# Patient Record
Sex: Male | Born: 1990 | Race: White | Hispanic: No | Marital: Single | Smoking: Never smoker
Health system: Southern US, Community
[De-identification: ages and names within clinical notes are randomized; demographics above are authoritative.]

---

## 2015-04-06 ENCOUNTER — Emergency Department
Admission: EM | Admit: 2015-04-06 | Discharge: 2015-04-06 | Disposition: A | Discharge status: Home / Self Care | Payer: Self-pay | Attending: Emergency Medicine | Admitting: Emergency Medicine

## 2015-04-06 ENCOUNTER — Encounter: Payer: Self-pay | Admitting: Emergency Medicine

## 2015-04-06 ENCOUNTER — Emergency Department: Payer: Self-pay

## 2015-04-06 ENCOUNTER — Other Ambulatory Visit: Payer: Self-pay

## 2015-04-06 DIAGNOSIS — R0789 Other chest pain: Secondary | ICD-10-CM | POA: Insufficient documentation

## 2015-04-06 LAB — CBC
HCT: 46.5 % (ref 40.0–52.0)
Hemoglobin: 15.9 g/dL (ref 13.0–18.0)
MCH: 29 pg (ref 26.0–34.0)
MCHC: 34.3 g/dL (ref 32.0–36.0)
MCV: 84.6 fL (ref 80.0–100.0)
PLATELETS: 182 10*3/uL (ref 150–440)
RBC: 5.49 MIL/uL (ref 4.40–5.90)
RDW: 12.7 % (ref 11.5–14.5)
WBC: 5 10*3/uL (ref 3.8–10.6)

## 2015-04-06 LAB — BASIC METABOLIC PANEL
Anion gap: 11 (ref 5–15)
BUN: 10 mg/dL (ref 6–20)
CALCIUM: 9.7 mg/dL (ref 8.9–10.3)
CO2: 28 mmol/L (ref 22–32)
CREATININE: 1.07 mg/dL (ref 0.61–1.24)
Chloride: 99 mmol/L — ABNORMAL LOW (ref 101–111)
GFR calc non Af Amer: >60 mL/min (ref >60)
Glucose, Bld: 83 mg/dL (ref 65–99)
Potassium: 4.3 mmol/L (ref 3.5–5.1)
SODIUM: 138 mmol/L (ref 135–145)

## 2015-04-06 LAB — TROPONIN I

## 2015-04-06 MED ORDER — HYDROCODONE-ACETAMINOPHEN 5-325 MG PO TABS: 1.0000 | ORAL_TABLET | Freq: Four times a day (QID) | ORAL | Status: AC | PRN
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
## 2015-04-06 NOTE — Discharge Instructions (Signed)
Chest Wall Pain °Chest wall pain is pain in or around the bones and muscles of your chest. It may take up to 6 weeks to get better. It may take longer if you must stay physically active in your work and activities.  °CAUSES  °Chest wall pain may happen on its own. However, it may be caused by: °· A viral illness like the flu. °· Injury. °· Coughing. °· Exercise. °· Arthritis. °· Fibromyalgia. °· Shingles. °HOME CARE INSTRUCTIONS  °· Avoid overtiring physical activity. Try not to strain or perform activities that cause pain. This includes any activities using your chest or your abdominal and side muscles, especially if heavy weights are used. °· Put ice on the sore area. °¨ Put ice in a plastic bag. °¨ Place a towel between your skin and the bag. °¨ Leave the ice on for 15-20 minutes per hour while awake for the first 2 days. °· Only take over-the-counter or prescription medicines for pain, discomfort, or fever as directed by your caregiver. °SEEK IMMEDIATE MEDICAL CARE IF:  °· Your pain increases, or you are very uncomfortable. °· You have a fever. °· Your chest pain becomes worse. °· You have new, unexplained symptoms. °· You have nausea or vomiting. °· You feel sweaty or lightheaded. °· You have a cough with phlegm (sputum), or you cough up blood. °MAKE SURE YOU:  °· Understand these instructions. °· Will watch your condition. °· Will get help right away if you are not doing well or get worse. °Document Released: 08/08/2005 Document Revised: 10/31/2011 Document Reviewed: 04/04/2011 °[employer]® Patient Information ©2015 [employer]. This information is not intended to replace advice given to you by your health care provider. Make sure you discuss any questions you have with your health care provider. ° °Please return immediately if condition worsens. Please contact her primary physician or the physician you were given for referral. If you have any specialist physicians involved in her treatment and plan please  also contact them. Thank you for using [facility]. ° °

## 2015-04-20 ENCOUNTER — Other Ambulatory Visit: Payer: Self-pay

## 2015-04-20 ENCOUNTER — Emergency Department: Payer: Self-pay

## 2015-04-20 ENCOUNTER — Encounter: Payer: Self-pay | Admitting: Emergency Medicine

## 2015-04-20 ENCOUNTER — Emergency Department
Admission: EM | Admit: 2015-04-20 | Discharge: 2015-04-20 | Disposition: A | Discharge status: Home / Self Care | Payer: Self-pay | Attending: Emergency Medicine | Admitting: Emergency Medicine

## 2015-04-20 DIAGNOSIS — R17 Unspecified jaundice: Secondary | ICD-10-CM

## 2015-04-20 DIAGNOSIS — R1013 Epigastric pain: Secondary | ICD-10-CM | POA: Insufficient documentation

## 2015-04-20 LAB — COMPREHENSIVE METABOLIC PANEL
ALBUMIN: 4.8 g/dL (ref 3.5–5.0)
ALT: 12 U/L — AB (ref 17–63)
ANION GAP: 7 (ref 5–15)
AST: 26 U/L (ref 15–41)
Alkaline Phosphatase: 57 U/L (ref 38–126)
BUN: 10 mg/dL (ref 6–20)
CHLORIDE: 104 mmol/L (ref 101–111)
CO2: 28 mmol/L (ref 22–32)
CREATININE: 1.05 mg/dL (ref 0.61–1.24)
Calcium: 9.9 mg/dL (ref 8.9–10.3)
GFR calc non Af Amer: >60 mL/min (ref >60)
GLUCOSE: 86 mg/dL (ref 65–99)
Potassium: 4 mmol/L (ref 3.5–5.1)
SODIUM: 139 mmol/L (ref 135–145)
Total Bilirubin: 3.1 mg/dL — ABNORMAL HIGH (ref 0.3–1.2)
Total Protein: 7.4 g/dL (ref 6.5–8.1)

## 2015-04-20 LAB — CBC WITH DIFFERENTIAL/PLATELET
Basophils Absolute: 0 10*3/uL (ref 0–0.1)
Basophils Relative: 1 %
Eosinophils Absolute: 0.1 10*3/uL (ref 0–0.7)
Eosinophils Relative: 2 %
HEMATOCRIT: 45.9 % (ref 40.0–52.0)
HEMOGLOBIN: 16.1 g/dL (ref 13.0–18.0)
LYMPHS ABS: 2.3 10*3/uL (ref 1.0–3.6)
Lymphocytes Relative: 43 %
MCH: 29.5 pg (ref 26.0–34.0)
MCHC: 35.2 g/dL (ref 32.0–36.0)
MCV: 83.7 fL (ref 80.0–100.0)
MONO ABS: 0.5 10*3/uL (ref 0.2–1.0)
MONOS PCT: 9 %
NEUTROS ABS: 2.5 10*3/uL (ref 1.4–6.5)
NEUTROS PCT: 45 %
Platelets: 185 10*3/uL (ref 150–440)
RBC: 5.48 MIL/uL (ref 4.40–5.90)
RDW: 12.6 % (ref 11.5–14.5)
WBC: 5.5 10*3/uL (ref 3.8–10.6)

## 2015-04-20 LAB — URINALYSIS COMPLETE WITH MICROSCOPIC (ARMC ONLY)
BACTERIA UA: NONE SEEN
Bilirubin Urine: NEGATIVE
GLUCOSE, UA: NEGATIVE mg/dL
Hgb urine dipstick: NEGATIVE
Ketones, ur: NEGATIVE mg/dL
Leukocytes, UA: NEGATIVE
Nitrite: NEGATIVE
Protein, ur: NEGATIVE mg/dL
Specific Gravity, Urine: 1.008 (ref 1.005–1.030)
pH: 7 (ref 5.0–8.0)

## 2015-04-20 MED ORDER — ONDANSETRON HCL 4 MG PO TABS: 4.0000 mg | ORAL_TABLET | Freq: Every day | ORAL | Status: AC | PRN

## 2015-04-20 MED ORDER — DICYCLOMINE HCL 20 MG PO TABS: 20.0000 mg | ORAL_TABLET | Freq: Three times a day (TID) | ORAL | Status: AC | PRN

## 2015-04-20 NOTE — ED Provider Notes (Signed)
[facility] Emergency Department Provider Note    ____________________________________________  Time seen: 1410  I have reviewed the triage vital signs and the nursing notes.   HISTORY  Chief Complaint Abdominal Pain and Dizziness   History limited by: Not Limited   HPI Tommy Dunn is a 24 y.o. male who presents to the emergency department today primarily because of concerns for abdominal pain, nausea and vomiting. He states that the symptoms started primarily this morning. He describes pain as being located in the epigastric and right upper quadrant. He states that it is usually a dull pain although there are sharp times. He states that the pain was worse when he tried eating some crackers this morning. He did have nausea and emesis afterwards. It was nonbloody emesis. He denies any recent diarrhea or change in his bowel movements. Denies any recent fevers.     History reviewed. No pertinent past medical history.  There are no active problems to display for this patient.   History reviewed. No pertinent past surgical history.  Current Outpatient Rx  Name  Route  Sig  Dispense  Refill  . HYDROcodone-acetaminophen (NORCO) 5-325 MG per tablet   Oral   Take 1 tablet by mouth every 6 (six) hours as needed for moderate pain.   20 tablet   0     Allergies Review of patient's allergies indicates no known allergies.  No family history on file.  Social History Social History  Substance Use Topics  . Smoking status: Never Smoker   . Smokeless tobacco: None  . Alcohol Use: No    Review of Systems  Constitutional: Negative for fever. Cardiovascular: Negative for chest pain. Respiratory: Negative for shortness of breath. Gastrointestinal: Positive for epigastric abdominal pain. Genitourinary: Negative for dysuria. Musculoskeletal: Negative for back pain. Skin: Negative for rash. Neurological: Negative for headaches, focal weakness or  numbness.   10-point ROS otherwise negative.  ____________________________________________   PHYSICAL EXAM:  VITAL SIGNS: ED Triage Vitals  Enc Vitals Group     BP 04/20/15 1157 119/86 mmHg     Pulse Rate 04/20/15 1157 97     Resp 04/20/15 1157 18     Temp 04/20/15 1157 98.3 F (36.8 C)     Temp Source 04/20/15 1157 Oral     SpO2 04/20/15 1157 99 %     Weight 04/20/15 1157 170 lb (77.111 kg)     Height 04/20/15 1157  (1.753 m)     Head Cir --      Peak Flow --      Pain Score 04/20/15 1157 8   Constitutional: Alert and oriented. Well appearing and in no distress. Eyes: Conjunctivae are normal. PERRL. Normal extraocular movements. ENT   Head: Normocephalic and atraumatic.   Nose: No congestion/rhinnorhea.   Mouth/Throat: Mucous membranes are moist.   Neck: No stridor. Hematological/Lymphatic/Immunilogical: No cervical lymphadenopathy. Cardiovascular: Normal rate, regular rhythm.  No murmurs, rubs, or gallops. Respiratory: Normal respiratory effort without tachypnea nor retractions. Breath sounds are clear and equal bilaterally. No wheezes/rales/rhonchi. Gastrointestinal: Soft and minimally tender in the epigastric and right upper quadrant. No rebound. No guarding. No distention.  Genitourinary: Deferred Musculoskeletal: Normal range of motion in all extremities. No joint effusions.  No lower extremity tenderness nor edema. Neurologic:  Normal speech and language. No gross focal neurologic deficits are appreciated. Speech is normal.  Skin:  Skin is warm, dry and intact. No rash noted. Psychiatric: Mood and affect are normal. Speech and behavior are  normal. Patient exhibits appropriate insight and judgment.  ____________________________________________    LABS (pertinent positives/negatives)  Labs Reviewed  COMPREHENSIVE METABOLIC PANEL - Abnormal; Notable for the following:    ALT 12 (*)    Total Bilirubin 3.1 (*)    All other components within  normal limits  CBC WITH DIFFERENTIAL/PLATELET  URINALYSIS COMPLETEWITH MICROSCOPIC ([facility] ONLY)     ____________________________________________   EKG  I, Phineas Semen, attending physician, personally viewed and interpreted this EKG  EKG Time: 1204 Rate: 72 Rhythm: normal sinus rhythm Axis: normal Intervals: normal QRS: normal ST changes: no st elevation    ____________________________________________    RADIOLOGY  RUQ US IMPRESSION: 1. Small amount of echogenic sludge in the gallbladder but no gallstones or findings for acute cholecystitis. 2. Normal caliber common bile duct. 3. Normal liver.  ____________________________________________   PROCEDURES  Procedure(s) performed: None  Critical Care performed: No  ____________________________________________   INITIAL IMPRESSION / ASSESSMENT AND PLAN / ED COURSE  Pertinent labs & imaging results that were available during my care of the patient were reviewed by me and considered in my medical decision making (see chart for details).  Patient presented to the emergency department today with complaints of abdominal pain nausea and vomiting. Patient's bilirubin was elevated however no other elevated obstructing enzymes. I did obtain an ultrasound which did not show any overly concerning findings of some mild gallbladder sludge. Will plan on treating for nausea vomiting and abdominal pain. Discussed with patient's importance of follow-up to recheck bilirubin.  ____________________________________________   FINAL CLINICAL IMPRESSION(S) / ED DIAGNOSES  Final diagnoses:  Epigastric abdominal pain  Elevated bilirubin     Phineas Semen, MD 04/20/15 1528

## 2015-04-20 NOTE — Discharge Instructions (Signed)
As we discussed please get your bilirubin level rechecked in roughly 1-2 weeks. It was 3.1 today. Please seek medical attention for any high fevers, chest pain, shortness of breath, change in behavior, persistent vomiting, bloody stool or any other new or concerning symptoms.   Abdominal Pain Many things can cause abdominal pain. Usually, abdominal pain is not caused by a disease and will improve without treatment. It can often be observed and treated at home. Your health care provider will do a physical exam and possibly order blood tests and X-rays to help determine the seriousness of your pain. However, in many cases, more time must pass before a clear cause of the pain can be found. Before that point, your health care provider may not know if you need more testing or further treatment. HOME CARE INSTRUCTIONS  Monitor your abdominal pain for any changes. The following actions may help to alleviate any discomfort you are experiencing:  Only take over-the-counter or prescription medicines as directed by your health care provider.  Do not take laxatives unless directed to do so by your health care provider.  Try a clear liquid diet (broth, tea, or water) as directed by your health care provider. Slowly move to a bland diet as tolerated. SEEK MEDICAL CARE IF:  You have unexplained abdominal pain.  You have abdominal pain associated with nausea or diarrhea.  You have pain when you urinate or have a bowel movement.  You experience abdominal pain that wakes you in the night.  You have abdominal pain that is worsened or improved by eating food.  You have abdominal pain that is worsened with eating fatty foods.  You have a fever. SEEK IMMEDIATE MEDICAL CARE IF:   Your pain does not go away within 2 hours.  You keep throwing up (vomiting).  Your pain is felt only in portions of the abdomen, such as the right side or the left lower portion of the abdomen.  You pass bloody or black tarry  stools. MAKE SURE YOU:  Understand these instructions.   Will watch your condition.   Will get help right away if you are not doing well or get worse.  Document Released: 05/18/2005 Document Revised: 08/13/2013 Document Reviewed: 04/17/2013 [facility] Patient Information 2015 [place], [place]. This information is not intended to replace advice given to you by your health care provider. Make sure you discuss any questions you have with your health care provider.
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
## 2015-04-20 NOTE — ED Notes (Signed)
Patient returned from Ultrasound. 

## 2015-04-20 NOTE — ED Notes (Signed)
Patient transported to Ultrasound 

## 2015-04-28 ENCOUNTER — Emergency Department
Admission: EM | Admit: 2015-04-28 | Discharge: 2015-04-28 | Disposition: A | Discharge status: Home / Self Care | Payer: Self-pay | Attending: Emergency Medicine | Admitting: Emergency Medicine

## 2015-04-28 ENCOUNTER — Encounter: Payer: Self-pay | Admitting: *Deleted

## 2015-04-28 ENCOUNTER — Emergency Department: Payer: Self-pay

## 2015-04-28 DIAGNOSIS — K5900 Constipation, unspecified: Secondary | ICD-10-CM | POA: Insufficient documentation

## 2015-04-28 DIAGNOSIS — K2970 Gastritis, unspecified, without bleeding: Secondary | ICD-10-CM | POA: Insufficient documentation

## 2015-04-28 DIAGNOSIS — R1084 Generalized abdominal pain: Secondary | ICD-10-CM

## 2015-04-28 LAB — COMPREHENSIVE METABOLIC PANEL
ALBUMIN: 4.5 g/dL (ref 3.5–5.0)
ALT: 9 U/L — ABNORMAL LOW (ref 17–63)
ANION GAP: 8 (ref 5–15)
AST: 22 U/L (ref 15–41)
Alkaline Phosphatase: 52 U/L (ref 38–126)
BUN: 10 mg/dL (ref 6–20)
CHLORIDE: 102 mmol/L (ref 101–111)
CO2: 25 mmol/L (ref 22–32)
Calcium: 9.2 mg/dL (ref 8.9–10.3)
Creatinine, Ser: 0.95 mg/dL (ref 0.61–1.24)
GFR calc Af Amer: >60 mL/min (ref >60)
GFR calc non Af Amer: >60 mL/min (ref >60)
GLUCOSE: 104 mg/dL — AB (ref 65–99)
POTASSIUM: 3.6 mmol/L (ref 3.5–5.1)
SODIUM: 135 mmol/L (ref 135–145)
Total Bilirubin: 1.5 mg/dL — ABNORMAL HIGH (ref 0.3–1.2)
Total Protein: 7.2 g/dL (ref 6.5–8.1)

## 2015-04-28 LAB — LIPASE, BLOOD: Lipase: 36 U/L (ref 22–51)

## 2015-04-28 LAB — CBC
HEMATOCRIT: 43.9 % (ref 40.0–52.0)
HEMOGLOBIN: 15.5 g/dL (ref 13.0–18.0)
MCH: 29.5 pg (ref 26.0–34.0)
MCHC: 35.4 g/dL (ref 32.0–36.0)
MCV: 83.5 fL (ref 80.0–100.0)
Platelets: 174 10*3/uL (ref 150–440)
RBC: 5.25 MIL/uL (ref 4.40–5.90)
RDW: 12.6 % (ref 11.5–14.5)
WBC: 5.8 10*3/uL (ref 3.8–10.6)

## 2015-04-28 MED ORDER — POLYETHYLENE GLYCOL 3350 17 GM/SCOOP PO POWD: 17.0000 g | Freq: Every day | ORAL | Status: AC

## 2015-04-28 MED ORDER — PANTOPRAZOLE SODIUM 40 MG PO TBEC: 40.0000 mg | DELAYED_RELEASE_TABLET | Freq: Every day | ORAL | Status: AC

## 2015-04-28 NOTE — ED Notes (Signed)
Lab called to report corrected results for total bilirubin of 1.5  Per Dr. Derrill Kay no followup needed.

## 2015-04-28 NOTE — ED Notes (Signed)
Patient transported to CT 

## 2015-04-28 NOTE — ED Provider Notes (Signed)
[facility] Emergency Department Provider Note  Time seen: 4:41 PM  I have reviewed the triage vital signs and the nursing notes.   HISTORY  Chief Complaint Abdominal Pain    HPI Tommy Dunn is a 24 y.o. male with no past medical history who presents the emergency department with continued abdominal pain 2 weeks. According to the patient he was seen in the emergency department for the same on 04/20/15. Patient states for the past 2 weeks he has had burning-type abdominal pain that is generalized. Patient cannot locate one area in particular that has been hurting more than any other area. Patient does note that it is worse with spicy foods or greasy foods. Patient's workup last time was within normal limits and he was discharged home with Phenergan and Bentyl. Patient states he has been taking these medications with little relief. Patient does note prior to this visit he was taking ibuprofen several times a day due to chronic headaches and back pain Patient denies any vomiting, but states he does get quite nauseated. Denies diarrhea, dysuria or fever. Describes his pain currently as moderate, burning sensation generalized over his entire abdomen.    History reviewed. No pertinent past medical history.  There are no active problems to display for this patient.   History reviewed. No pertinent past surgical history.  Current Outpatient Rx  Name  Route  Sig  Dispense  Refill  . dicyclomine (BENTYL) 20 MG tablet   Oral   Take 1 tablet (20 mg total) by mouth 3 (three) times daily as needed (abdominal pain).   20 tablet   0   . HYDROcodone-acetaminophen (NORCO) 5-325 MG per tablet   Oral   Take 1 tablet by mouth every 6 (six) hours as needed for moderate pain.   20 tablet   0   . ondansetron (ZOFRAN) 4 MG tablet   Oral   Take 1 tablet (4 mg total) by mouth daily as needed for nausea or vomiting.   20 tablet   0     Allergies Review of patient's  allergies indicates no known allergies.  No family history on file.  Social History Social History  Substance Use Topics  . Smoking status: Never Smoker   . Smokeless tobacco: None  . Alcohol Use: No    Review of Systems Constitutional: Negative for fever. Cardiovascular: Negative for chest pain. Respiratory: Negative for shortness of breath. Gastrointestinal:  Positive for abdominal pain/burning. Genitourinary: Negative for dysuria. Musculoskeletal: Negative for back pain. Skin: Negative for rash. Neurological: Negative for headache 10-point ROS otherwise negative.  ____________________________________________   PHYSICAL EXAM:  VITAL SIGNS: ED Triage Vitals  Enc Vitals Group     BP 04/28/15 1607 121/67 mmHg     Pulse Rate 04/28/15 1607 78     Resp 04/28/15 1607 18     Temp 04/28/15 1607 98 F (36.7 C)     Temp Source 04/28/15 1607 Oral     SpO2 04/28/15 1607 98 %     Weight 04/28/15 1607 170 lb (77.111 kg)     Height 04/28/15 1607  (1.753 m)     Head Cir --      Peak Flow --      Pain Score 04/28/15 1607 8     Pain Loc --      Pain Edu? --      Excl. in GC? --     Constitutional: Alert and oriented. Well appearing and in no distress. Eyes:  Normal exam ENT   Mouth/Throat: Mucous membranes are moist. Cardiovascular: Normal rate, regular rhythm. No murmur Respiratory: Normal respiratory effort without tachypnea nor retractions. Breath sounds are clear and equal bilaterally. No wheezes/rales/rhonchi. Gastrointestinal:  Soft, mild diffuse tenderness palpation without rebound or guarding. No CVA tenderness to palpation. No distention. Musculoskeletal: Nontender with normal range of motion in all extremities. No lower extremity tenderness or edema. Neurologic:  Normal speech and language. No gross focal neurologic deficits  Skin:  Skin is warm, dry and intact.  Psychiatric: Mood and affect are normal. Speech and behavior are normal.    ____________________________________________   RADIOLOGY  CT shows large stool burden otherwise within normal limits  ____________________________________________    INITIAL IMPRESSION / ASSESSMENT AND PLAN / ED COURSE  Pertinent labs & imaging results that were available during my care of the patient were reviewed by me and considered in my medical decision making (see chart for details).  Patient with diffuse/generalized abdominal pain for the past 2 weeks. Given this is his second visit for the same symptoms will proceed with labs, and a CT scan to help further evaluate. Patient's labs from last visit were largely within normal limits besides a slightly elevated bilirubin level. Patient's symptoms are suggestive of gastritis, and he does admit excessive NSAID use prior to 2 weeks ago. We will treat pain, and monitor in the emergency department.  Patient's labs are largely within normal limits. CT scan consistent with constipation otherwise within normal limits. Believe the patient's abdominal discomfort is likely a combination of gastritis due to excessive NSAID use, as well as constipation. I discussed with the patient Protonix, Maalox as needed, and MiraLAX. Patient is agreeable to plan and will follow-up with your medicine and symptoms do not improve. We have also discussed strict abdominal pain return precautions for any worsening pain, fever, patient agreeable plan.  ____________________________________________   FINAL CLINICAL IMPRESSION(S) / ED DIAGNOSES  abdominal pain Gastritis Constipation  Minna Antis, MD 04/28/15 1754

## 2015-04-28 NOTE — Discharge Instructions (Signed)
Abdominal Pain °Many things can cause abdominal pain. Usually, abdominal pain is not caused by a disease and will improve without treatment. It can often be observed and treated at home. Your health care provider will do a physical exam and possibly order blood tests and X-rays to help determine the seriousness of your pain. However, in many cases, more time must pass before a clear cause of the pain can be found. Before that point, your health care provider may not know if you need more testing or further treatment. °HOME CARE INSTRUCTIONS  °Monitor your abdominal pain for any changes. The following actions may help to alleviate any discomfort you are experiencing: °· Only take over-the-counter or prescription medicines as directed by your health care provider. °· Do not take laxatives unless directed to do so by your health care provider. °· Try a clear liquid diet (broth, tea, or water) as directed by your health care provider. Slowly move to a bland diet as tolerated. °SEEK MEDICAL CARE IF: °· You have unexplained abdominal pain. °· You have abdominal pain associated with nausea or diarrhea. °· You have pain when you urinate or have a bowel movement. °· You experience abdominal pain that wakes you in the night. °· You have abdominal pain that is worsened or improved by eating food. °· You have abdominal pain that is worsened with eating fatty foods. °· You have a fever. °SEEK IMMEDIATE MEDICAL CARE IF:  °· Your pain does not go away within 2 hours. °· You keep throwing up (vomiting). °· Your pain is felt only in portions of the abdomen, such as the right side or the left lower portion of the abdomen. °· You pass bloody or black tarry stools. °MAKE SURE YOU: °· Understand these instructions.   °· Will watch your condition.   °· Will get help right away if you are not doing well or get worse.   °Document Released: 05/18/2005 Document Revised: 08/13/2013 Document Reviewed: 04/17/2013 °[employer]® Patient Information  ©2015 [employer]. This information is not intended to replace advice given to you by your health care provider. Make sure you discuss any questions you have with your health care provider. ° °Constipation °Constipation is when a person has fewer than three bowel movements a week, has difficulty having a bowel movement, or has stools that are dry, hard, or larger than normal. As people grow older, constipation is more common. If you try to fix constipation with medicines that make you have a bowel movement (laxatives), the problem may get worse. Long-term laxative use may cause the muscles of the colon to become weak. A low-fiber diet, not taking in enough fluids, and taking certain medicines may make constipation worse.  °CAUSES  °· Certain medicines, such as antidepressants, pain medicine, iron supplements, antacids, and water pills.   °· Certain diseases, such as diabetes, irritable bowel syndrome (IBS), thyroid disease, or depression.   °· Not drinking enough water.   °· Not eating enough fiber-rich foods.   °· Stress or travel.   °· Lack of physical activity or exercise.   °· Ignoring the urge to have a bowel movement.   °· Using laxatives too much.   °SIGNS AND SYMPTOMS  °· Having fewer than three bowel movements a week.   °· Straining to have a bowel movement.   °· Having stools that are hard, dry, or larger than normal.   °· Feeling full or bloated.   °· Pain in the lower abdomen.   °· Not feeling relief after having a bowel movement.   °DIAGNOSIS  °Your health care provider will take   a medical history and perform a physical exam. Further testing may be done for severe constipation. Some tests may include:  A barium enema X-ray to examine your rectum, colon, and, sometimes, your small intestine.   A sigmoidoscopy to examine your lower colon.   A colonoscopy to examine your entire colon. TREATMENT  Treatment will depend on the severity of your constipation and what is causing it. Some dietary  treatments include drinking more fluids and eating more fiber-rich foods. Lifestyle treatments may include regular exercise. If these diet and lifestyle recommendations do not help, your health care provider may recommend taking over-the-counter laxative medicines to help you have bowel movements. Prescription medicines may be prescribed if over-the-counter medicines do not work.  HOME CARE INSTRUCTIONS   Eat foods that have a lot of fiber, such as fruits, vegetables, whole grains, and beans.  Limit foods high in fat and processed sugars, such as french fries, hamburgers, cookies, candies, and soda.   A fiber supplement may be added to your diet if you cannot get enough fiber from foods.   Drink enough fluids to keep your urine clear or pale yellow.   Exercise regularly or as directed by your health care provider.   Go to the restroom when you have the urge to go. Do not hold it.   Only take over-the-counter or prescription medicines as directed by your health care provider. Do not take other medicines for constipation without talking to your health care provider first.  SEEK IMMEDIATE MEDICAL CARE IF:   You have bright red blood in your stool.   Your constipation lasts for more than 4 days or gets worse.   You have abdominal or rectal pain.   You have thin, pencil-like stools.   You have unexplained weight loss. MAKE SURE YOU:   Understand these instructions.  Will watch your condition.  Will get help right away if you are not doing well or get worse. Document Released: 05/06/2004 Document Revised: 08/13/2013 Document Reviewed: 05/20/2013 [facility] Patient Information 2015 [place], [place]. This information is not intended to replace advice given to you by your health care provider. Make sure you discuss any questions you have with your health care provider.  Gastritis, Adult Gastritis is soreness and puffiness (inflammation) of the lining of the stomach. If you do not  get help, gastritis can cause bleeding and sores (ulcers) in the stomach. HOME CARE   Only take medicine as told by your doctor.  If you were given antibiotic medicines, take them as told. Finish the medicines even if you start to feel better.  Drink enough fluids to keep your pee (urine) clear or pale yellow.  Avoid foods and drinks that make your problems worse. Foods you may want to avoid include:  Caffeine or alcohol.  Chocolate.  Mint.  Garlic and onions.  Spicy foods.  Citrus fruits, including oranges, lemons, or limes.  Food containing tomatoes, including sauce, chili, salsa, and pizza.  Fried and fatty foods.  Eat small meals throughout the day instead of large meals. GET HELP RIGHT AWAY IF:   You have black or dark red poop (stools).  You throw up (vomit) blood. It may look like coffee grounds.  You cannot keep fluids down.  Your belly (abdominal) pain gets worse.  You have a fever.  You do not feel better after 1 week.  You have any other questions or concerns. MAKE SURE YOU:   Understand these instructions.  Will watch your condition.  Will get  help right away if you are not doing well or get worse. Document Released: 01/25/2008 Document Revised: 10/31/2011 Document Reviewed: 09/21/2011 [facility] Patient Information 2015 [place], [place]. This information is not intended to replace advice given to you by your health care provider. Make sure you discuss any questions you have with your health care provider.

## 2015-04-28 NOTE — ED Notes (Addendum)
Pt reports pain worsens with specific foods. Pt reports having taken NSAIDS at home for pain management in the past. Pt reports medications taken everyday. Pt was seen 1 week ago in ED and dx with ulcers. No relief since being seen and pt reports pain has increased.

## 2015-04-28 NOTE — ED Notes (Signed)
Pt was seen in ER 1 week ago for abdominal pain, pt reports pain is not better, pt report spitting up a small amount of blood in vomit today

## 2016-03-17 IMAGING — CT CT RENAL STONE PROTOCOL
1 of 2 series · 5 of 32 positions shown, 10 images · non-contrast
Comparison: None.

CLINICAL DATA: Abdomen pain for 1 week.

EXAM:
CT ABDOMEN AND PELVIS WITHOUT CONTRAST
TECHNIQUE: Multidetector CT imaging of the abdomen and pelvis was performed
following the standard protocol without IV contrast.

[Series 4: lung windows · axial · 0.70mm/px · z∈[-726,-646]mm · 5 of 26 slices shown, 10 images]
[im 5/26  soft-tissue]
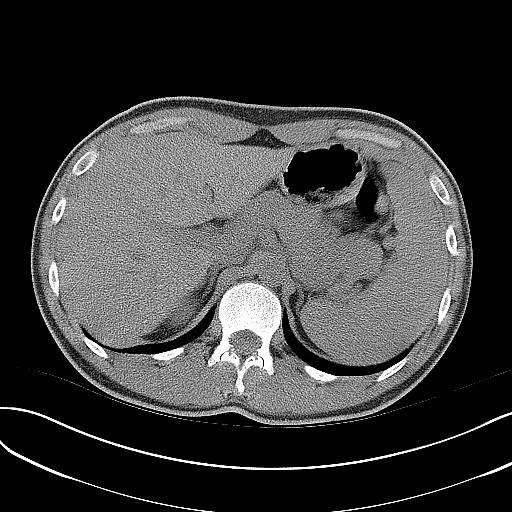
[im 5/26  bone]
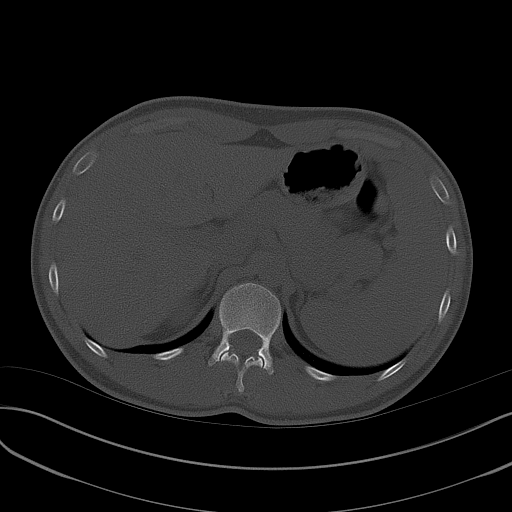
[im 9/26  soft-tissue]
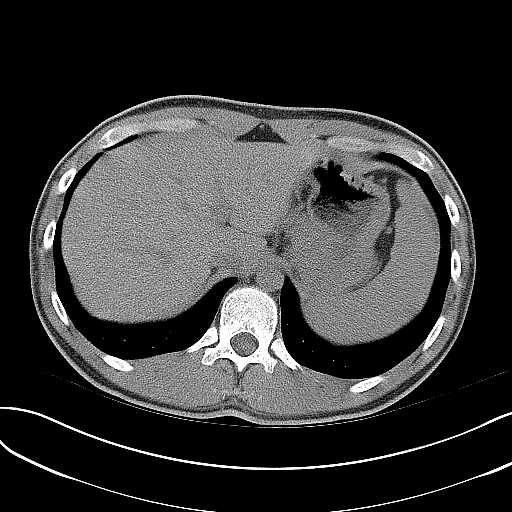
[im 9/26  lung]
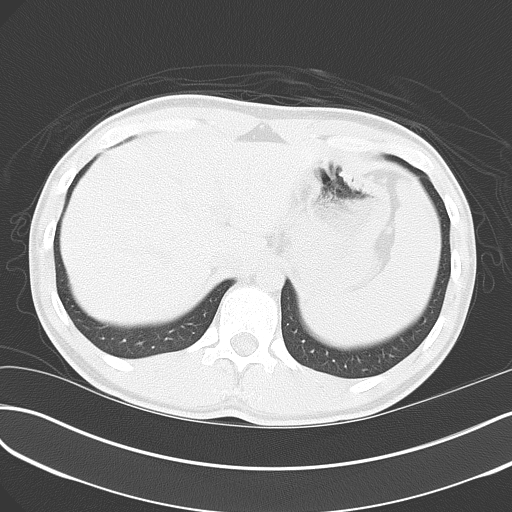
[im 13/26  soft-tissue]
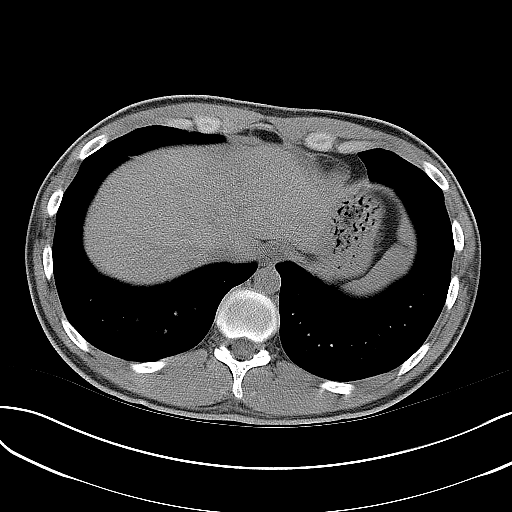
[im 13/26  lung]
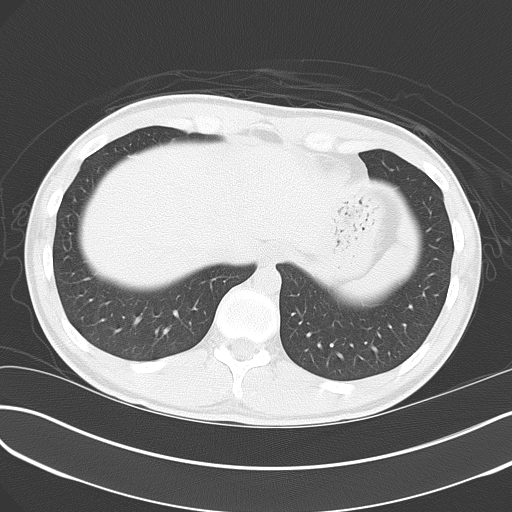
[im 17/26  soft-tissue]
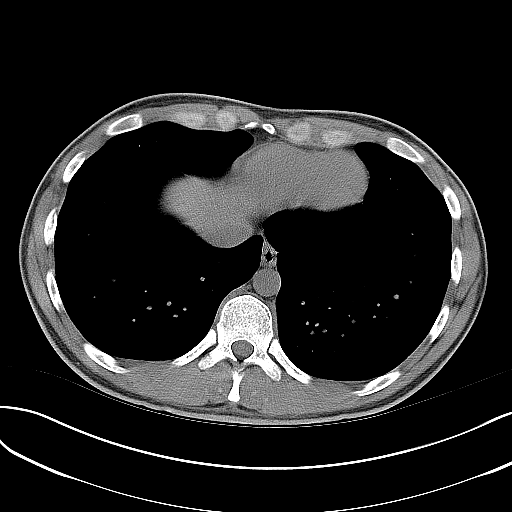
[im 17/26  lung]
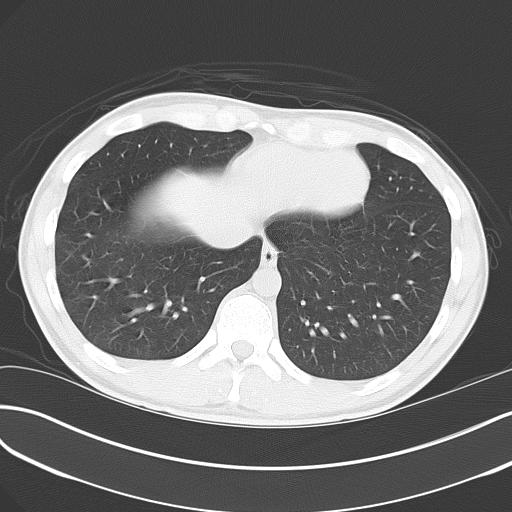
[im 21/26  soft-tissue]
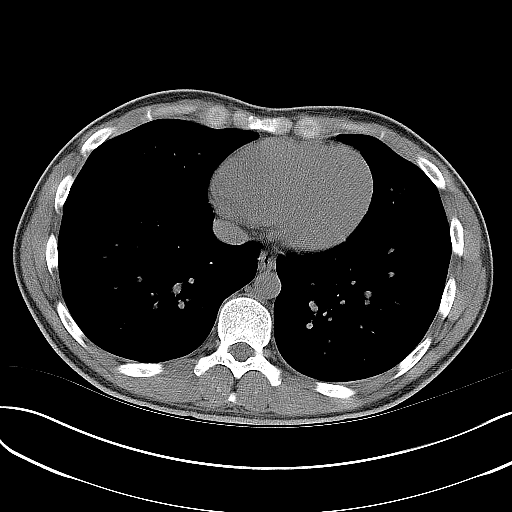
[im 21/26  lung]
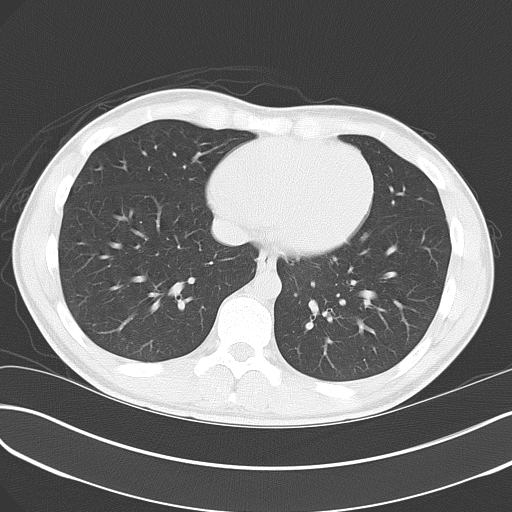

[5 of 32 positions shown; findings below may reference images not displayed]

FINDINGS: There is no nephrolithiasis or hydroureteronephrosis bilaterally.

The liver, spleen, pancreas, gallbladder, adrenal glands and kidneys
are normal. The aorta is normal. There is no abdominal
lymphadenopathy. There is no small bowel obstruction or
diverticulitis. The appendix is not definitely seen but no
inflammation is noted around cecum. Moderate bowel content is
identified throughout colon.

Fluid-filled bladder is normal. Prostate gland is normal. The lung
bases are clear. No acute abnormalities identified within the
visualized bones.
IMPRESSION: No nephrolithiasis or hydroureteronephrosis bilaterally.

Moderate bowel content throughout colon consistent with
constipation.
